# Patient Record
Sex: Male | Born: 1985 | Race: Black or African American | Hispanic: No | Marital: Single | Smoking: Current every day smoker
Health system: Southern US, Community
[De-identification: ages and names within clinical notes are randomized; demographics above are authoritative.]

---

## 2005-01-11 ENCOUNTER — Emergency Department: Payer: Self-pay | Admitting: Emergency Medicine

## 2005-08-17 ENCOUNTER — Emergency Department: Payer: Self-pay | Admitting: Unknown Physician Specialty

## 2010-05-09 ENCOUNTER — Emergency Department: Payer: Self-pay | Admitting: Emergency Medicine

## 2014-02-25 ENCOUNTER — Emergency Department: Payer: Self-pay | Admitting: Emergency Medicine

## 2016-08-11 ENCOUNTER — Encounter (HOSPITAL_COMMUNITY): Payer: Self-pay | Admitting: *Deleted

## 2016-08-11 DIAGNOSIS — S76212A Strain of adductor muscle, fascia and tendon of left thigh, initial encounter: Secondary | ICD-10-CM | POA: Insufficient documentation

## 2016-08-11 DIAGNOSIS — R1032 Left lower quadrant pain: Secondary | ICD-10-CM | POA: Insufficient documentation

## 2016-08-11 DIAGNOSIS — N50812 Left testicular pain: Secondary | ICD-10-CM | POA: Insufficient documentation

## 2016-08-11 DIAGNOSIS — F17200 Nicotine dependence, unspecified, uncomplicated: Secondary | ICD-10-CM | POA: Insufficient documentation

## 2016-08-11 DIAGNOSIS — Y929 Unspecified place or not applicable: Secondary | ICD-10-CM | POA: Insufficient documentation

## 2016-08-11 DIAGNOSIS — Y939 Activity, unspecified: Secondary | ICD-10-CM | POA: Insufficient documentation

## 2016-08-11 DIAGNOSIS — Y999 Unspecified external cause status: Secondary | ICD-10-CM | POA: Insufficient documentation

## 2016-08-11 DIAGNOSIS — W500XXA Accidental hit or strike by another person, initial encounter: Secondary | ICD-10-CM | POA: Insufficient documentation
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
## 2016-08-12 ENCOUNTER — Emergency Department (HOSPITAL_COMMUNITY): Payer: Self-pay

## 2016-08-12 ENCOUNTER — Emergency Department (HOSPITAL_COMMUNITY)
Admission: EM | Admit: 2016-08-12 | Discharge: 2016-08-12 | Disposition: A | Discharge status: Home / Self Care | Payer: Self-pay | Attending: Emergency Medicine | Admitting: Emergency Medicine

## 2016-08-12 DIAGNOSIS — S76212A Strain of adductor muscle, fascia and tendon of left thigh, initial encounter: Secondary | ICD-10-CM

## 2016-08-12 DIAGNOSIS — N50819 Testicular pain, unspecified: Secondary | ICD-10-CM

## 2016-08-12 LAB — CBC WITH DIFFERENTIAL/PLATELET
BASOS ABS: 0 10*3/uL (ref 0.0–0.1)
BASOS PCT: 0 %
EOS PCT: 3 %
Eosinophils Absolute: 0.2 10*3/uL (ref 0.0–0.7)
HCT: 39.7 % (ref 39.0–52.0)
Hemoglobin: 13.3 g/dL (ref 13.0–17.0)
Lymphocytes Relative: 28 %
Lymphs Abs: 2.7 10*3/uL (ref 0.7–4.0)
MCH: 31.3 pg (ref 26.0–34.0)
MCHC: 33.5 g/dL (ref 30.0–36.0)
MCV: 93.4 fL (ref 78.0–100.0)
MONO ABS: 0.8 10*3/uL (ref 0.1–1.0)
Monocytes Relative: 9 %
Neutro Abs: 6 10*3/uL (ref 1.7–7.7)
Neutrophils Relative %: 60 %
PLATELETS: 389 10*3/uL (ref 150–400)
RBC: 4.25 MIL/uL (ref 4.22–5.81)
RDW: 12.8 % (ref 11.5–15.5)
WBC: 9.8 10*3/uL (ref 4.0–10.5)

## 2016-08-12 LAB — URINALYSIS, ROUTINE W REFLEX MICROSCOPIC
GLUCOSE, UA: NEGATIVE mg/dL
Ketones, ur: NEGATIVE mg/dL
Nitrite: NEGATIVE
PROTEIN: NEGATIVE mg/dL
Specific Gravity, Urine: 1.031 — ABNORMAL HIGH (ref 1.005–1.030)
pH: 6 (ref 5.0–8.0)

## 2016-08-12 LAB — BASIC METABOLIC PANEL
ANION GAP: 10 (ref 5–15)
BUN: 15 mg/dL (ref 6–20)
CALCIUM: 9.7 mg/dL (ref 8.9–10.3)
CO2: 25 mmol/L (ref 22–32)
Chloride: 103 mmol/L (ref 101–111)
Creatinine, Ser: 1.23 mg/dL (ref 0.61–1.24)
GLUCOSE: 103 mg/dL — AB (ref 65–99)
Potassium: 3.6 mmol/L (ref 3.5–5.1)
Sodium: 138 mmol/L (ref 135–145)

## 2016-08-12 LAB — URINE MICROSCOPIC-ADD ON

## 2016-08-12 MED ORDER — IOPAMIDOL (ISOVUE-300) INJECTION 61%
INTRAVENOUS | Status: AC
  Administered 2016-08-12: 100 mL
  Filled 2016-08-12: qty 100

## 2016-08-12 MED ORDER — NAPROXEN 500 MG PO TABS: 500.0000 mg | ORAL_TABLET | Freq: Two times a day (BID) | ORAL | 0 refills | Status: AC

## 2016-08-12 MED ORDER — FENTANYL CITRATE (PF) 100 MCG/2ML IJ SOLN
50.0000 ug | Freq: Once | INTRAMUSCULAR | Status: AC
  Administered 2016-08-12: 50 ug via INTRAVENOUS
  Filled 2016-08-12: qty 2

## 2016-08-12 NOTE — Discharge Instructions (Signed)
Use antiinflammatories and ice as prescribed. Followup with your doctor. Return to the ED if you develop new or worsening symptoms.
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
## 2016-08-12 NOTE — ED Notes (Signed)
Patient transported to CT 
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
## 2017-03-10 ENCOUNTER — Encounter (HOSPITAL_COMMUNITY): Payer: Self-pay | Admitting: Emergency Medicine

## 2017-03-10 ENCOUNTER — Emergency Department (HOSPITAL_COMMUNITY): Payer: Self-pay

## 2017-03-10 ENCOUNTER — Emergency Department (HOSPITAL_COMMUNITY)
Admission: EM | Admit: 2017-03-10 | Discharge: 2017-03-10 | Disposition: A | Discharge status: Home / Self Care | Payer: Self-pay | Attending: Emergency Medicine | Admitting: Emergency Medicine

## 2017-03-10 DIAGNOSIS — R109 Unspecified abdominal pain: Secondary | ICD-10-CM

## 2017-03-10 DIAGNOSIS — R1084 Generalized abdominal pain: Secondary | ICD-10-CM | POA: Insufficient documentation

## 2017-03-10 DIAGNOSIS — F17200 Nicotine dependence, unspecified, uncomplicated: Secondary | ICD-10-CM | POA: Insufficient documentation

## 2017-03-10 LAB — CBC WITH DIFFERENTIAL/PLATELET
BASOS ABS: 0 10*3/uL (ref 0.0–0.1)
Basophils Relative: 0 %
Eosinophils Absolute: 0.1 10*3/uL (ref 0.0–0.7)
Eosinophils Relative: 1 %
HEMATOCRIT: 45.7 % (ref 39.0–52.0)
Hemoglobin: 15.5 g/dL (ref 13.0–17.0)
LYMPHS PCT: 28 %
Lymphs Abs: 2.1 10*3/uL (ref 0.7–4.0)
MCH: 32.2 pg (ref 26.0–34.0)
MCHC: 33.9 g/dL (ref 30.0–36.0)
MCV: 95 fL (ref 78.0–100.0)
Monocytes Absolute: 0.6 10*3/uL (ref 0.1–1.0)
Monocytes Relative: 8 %
Neutro Abs: 4.8 10*3/uL (ref 1.7–7.7)
Neutrophils Relative %: 63 %
PLATELETS: 389 10*3/uL (ref 150–400)
RBC: 4.81 MIL/uL (ref 4.22–5.81)
RDW: 12.6 % (ref 11.5–15.5)
WBC: 7.6 10*3/uL (ref 4.0–10.5)

## 2017-03-10 LAB — URINALYSIS, ROUTINE W REFLEX MICROSCOPIC
BACTERIA UA: NONE SEEN
Bilirubin Urine: NEGATIVE
Glucose, UA: NEGATIVE mg/dL
KETONES UR: NEGATIVE mg/dL
Leukocytes, UA: NEGATIVE
Nitrite: NEGATIVE
PH: 5 (ref 5.0–8.0)
PROTEIN: 30 mg/dL — AB
SQUAMOUS EPITHELIAL / LPF: NONE SEEN
Specific Gravity, Urine: 1.033 — ABNORMAL HIGH (ref 1.005–1.030)

## 2017-03-10 LAB — LIPASE, BLOOD: LIPASE: 40 U/L (ref 11–51)

## 2017-03-10 LAB — COMPREHENSIVE METABOLIC PANEL
ALT: 25 U/L (ref 17–63)
AST: 22 U/L (ref 15–41)
Albumin: 3.6 g/dL (ref 3.5–5.0)
Alkaline Phosphatase: 52 U/L (ref 38–126)
Anion gap: 7 (ref 5–15)
BILIRUBIN TOTAL: 1 mg/dL (ref 0.3–1.2)
BUN: 7 mg/dL (ref 6–20)
CHLORIDE: 106 mmol/L (ref 101–111)
CO2: 26 mmol/L (ref 22–32)
CREATININE: 1.09 mg/dL (ref 0.61–1.24)
Calcium: 9 mg/dL (ref 8.9–10.3)
GFR calc Af Amer: >60 mL/min (ref >60)
GLUCOSE: 93 mg/dL (ref 65–99)
Potassium: 3.7 mmol/L (ref 3.5–5.1)
Sodium: 139 mmol/L (ref 135–145)
Total Protein: 6.2 g/dL — ABNORMAL LOW (ref 6.5–8.1)

## 2017-03-10 MED ORDER — IOPAMIDOL (ISOVUE-300) INJECTION 61%
INTRAVENOUS | Status: AC
  Administered 2017-03-10: 100 mL via INTRAVENOUS
  Filled 2017-03-10: qty 100

## 2017-03-10 MED ORDER — DICYCLOMINE HCL 20 MG PO TABS: 20.0000 mg | ORAL_TABLET | Freq: Two times a day (BID) | ORAL | 0 refills | Status: AC

## 2017-03-10 MED ORDER — SODIUM CHLORIDE 0.9 % IV BOLUS (SEPSIS)
1000.0000 mL | Freq: Once | INTRAVENOUS | Status: AC
  Administered 2017-03-10: 1000 mL via INTRAVENOUS

## 2017-03-10 MED ORDER — GI COCKTAIL ~~LOC~~
30.0000 mL | Freq: Once | ORAL | Status: AC
  Administered 2017-03-10: 30 mL via ORAL
  Filled 2017-03-10: qty 30

## 2017-03-10 MED ORDER — FAMOTIDINE 20 MG PO TABS: 20.0000 mg | ORAL_TABLET | Freq: Two times a day (BID) | ORAL | 0 refills | Status: AC

## 2017-03-10 NOTE — ED Notes (Signed)
Lab reports there are tubes of blood for this patient in lab. Will run ordered labs.

## 2017-03-10 NOTE — ED Notes (Signed)
ED Provider at bedside. 

## 2017-03-10 NOTE — ED Triage Notes (Signed)
Pt. Stated, I've had stomach pain with N/V/D for 5 days.

## 2017-03-10 NOTE — Discharge Instructions (Signed)
Your blood work including electrolytes, kidney function, liver function, pancreas function all normal. Your CT scan is normal as well. Take pepcid and bentyl as prescribed. Follow up with family doctor if not improving this week. Drink plenty of fluids. Eat bland diet until improved.

## 2017-03-10 NOTE — ED Provider Notes (Signed)
[facility]PT Provider Note   CSN: [id number] Arrival date & time: 03/10/17  1016     History   Chief Complaint Chief Complaint  Patient presents with  . Abdominal Pain  . Nausea  . Diarrhea  . Emesis    HPI Natanael Saladin. is a 31 y.o. male.  HPI Abdulla Pooley. is a 31 y.o. male presents to ED with complaint of abdominal pain, nausea, vomiting for 5 days. States symptoms are worsening. Reports diffuse, crampy abdominal pain that is worse with eating. Reports intermittent loose stools with hard stools. Initially states was constipated but has had several bowel movements since then, with some water diarrhea. Denies blood in stool. Although vomiting, states is able to keep some fluids down. Reports loss of appetite. Denies fever, chills. No urinary symptoms. No back pain. No other complaints.   History reviewed. No pertinent past medical history.  There are no active problems to display for this patient.   History reviewed. No pertinent surgical history.     Home Medications    Prior to Admission medications   Medication Sig Start Date End Date Taking? Authorizing Provider  naproxen (NAPROSYN) 500 MG tablet Take 1 tablet (500 mg total) by mouth 2 (two) times daily. 08/12/16   [name], MD    Family History No family history on file.  Social History Social History  Substance Use Topics  . Smoking status: Current Every Day Smoker  . Smokeless tobacco: Never Used  . Alcohol use Yes     Allergies   Patient has no known allergies.   Review of Systems Review of Systems  Constitutional: Negative for chills and fever.  Respiratory: Negative for cough, chest tightness and shortness of breath.   Cardiovascular: Negative for chest pain, palpitations and leg swelling.  Gastrointestinal: Positive for abdominal pain, diarrhea, nausea and vomiting. Negative for abdominal distention.  Genitourinary: Negative for dysuria, frequency, hematuria and  urgency.  Musculoskeletal: Negative for arthralgias, myalgias, neck pain and neck stiffness.  Skin: Negative for rash.  Allergic/Immunologic: Negative for immunocompromised state.  Neurological: Negative for dizziness, weakness, light-headedness, numbness and headaches.  All other systems reviewed and are negative.    Physical Exam Updated Vital Signs BP 119/86 (BP Location: Right Arm)   Pulse 63   Temp 97.6 F (36.4 C) (Oral)   Resp 20   Ht 5\' 8"  (1.727 m)   Wt 91.2 kg (201 lb)   SpO2 99%   BMI 30.56 kg/m   Physical Exam  Constitutional: He is oriented to person, place, and time. He appears well-developed and well-nourished. No distress.  HENT:  Head: Normocephalic and atraumatic.  Eyes: Conjunctivae are normal.  Neck: Neck supple.  Cardiovascular: Normal rate, regular rhythm and normal heart sounds.   Pulmonary/Chest: Effort normal. No respiratory distress. He has no wheezes. He has no rales.  Abdominal: Soft. Bowel sounds are normal. He exhibits no distension. There is tenderness. There is no rebound.  Diffuse tenderness worse in the left upper quadrant and epigastric area  Musculoskeletal: He exhibits no edema.  Neurological: He is alert and oriented to person, place, and time.  Skin: Skin is warm and dry.  Nursing note and vitals reviewed.    ED Treatments / Results  Labs (all labs ordered are listed, but only abnormal results are displayed) Labs Reviewed  COMPREHENSIVE METABOLIC PANEL - Abnormal; Notable for the following:       Result Value   Total Protein 6.2 (*)  All other components within normal limits  URINALYSIS, ROUTINE W REFLEX MICROSCOPIC - Abnormal; Notable for the following:    Color, Urine AMBER (*)    Specific Gravity, Urine 1.033 (*)    Hgb urine dipstick SMALL (*)    Protein, ur 30 (*)    All other components within normal limits  CBC WITH DIFFERENTIAL/PLATELET  LIPASE, BLOOD    EKG  EKG Interpretation None       Radiology Ct  Abdomen Pelvis W Contrast  Result Date: 03/10/2017 CLINICAL DATA:  Abdominal pain for several days with nausea, vomiting and diarrhea. EXAM: CT ABDOMEN AND PELVIS WITH CONTRAST TECHNIQUE: Multidetector CT imaging of the abdomen and pelvis was performed using the standard protocol following bolus administration of intravenous contrast. CONTRAST:  ISOVUE-300 IOPAMIDOL (ISOVUE-300) INJECTION 61% COMPARISON:  08/12/2016 CT abdomen/ pelvis. FINDINGS: Lower chest: No significant pulmonary nodules or acute consolidative airspace disease. Hepatobiliary: Normal liver with no liver mass. Normal gallbladder with no radiopaque cholelithiasis. No biliary ductal dilatation. Pancreas: Normal, with no mass or duct dilation. Spleen: Normal size. No mass. Adrenals/Urinary Tract: Normal adrenals. Subcentimeter hypodense renal cortical lesions in the lower kidneys bilaterally are too small to characterize, appear unchanged and require no further follow-up. No new renal lesions. No hydronephrosis. Normal bladder. Stomach/Bowel: Grossly normal stomach. Normal caliber small bowel with no small bowel wall thickening. Normal appendix. Oral contrast progresses to the rectum. No definite large bowel wall thickening or pericolonic fat stranding. No colonic diverticulosis. Vascular/Lymphatic: Normal caliber abdominal aorta. Patent portal, splenic, hepatic and renal veins. No pathologically enlarged lymph nodes in the abdomen or pelvis. Reproductive: Normal size prostate. Other: No pneumoperitoneum, ascites or focal fluid collection. Stable small fat containing umbilical hernia. Musculoskeletal: No aggressive appearing focal osseous lesions. IMPRESSION: No acute abnormality. No evidence of bowel obstruction or acute bowel inflammation. Normal appendix. Stable small fat containing umbilical hernia. Electronically Signed   By: Delbert Phenix M.D.   On: 03/10/2017 14:49    Procedures Procedures (including critical care  time)  Medications Ordered in ED Medications  gi cocktail (Maalox,Lidocaine,Donnatal) (30 mLs Oral Given 03/10/17 1339)  sodium chloride 0.9 % bolus 1,000 mL (1,000 mLs Intravenous New Bag/Given 03/10/17 1340)  iopamidol (ISOVUE-300) 61 % injection (100 mLs Intravenous Contrast Given 03/10/17 1409)     Initial Impression / Assessment and Plan / ED Course  I have reviewed the triage vital signs and the nursing notes.  Pertinent labs & imaging results that were available during my care of the patient were reviewed by me and considered in my medical decision making (see chart for details).     He should emergency department with worsening abdominal pain, nausea, vomiting, intermittent diarrhea. Patient's pain is worsening over last 5 days. He does have significant tenderness worse in the left upper quadrant epigastric area. Will check labs, get CT abdomen and pelvis for further evaluation.  2:57 PM Labs and CT all unremarkable. Patient had some pain relief with GI cocktail. Question gastritis versus viral gastrointestinal infection. Will start on Pepcid, Bentyl for abdominal cramping, follow-up with family doctor as needed. Tolerating POs in ED.   Vitals:   03/10/17 1038 03/10/17 1039 03/10/17 1251  BP: (!) 136/93  119/86  Pulse: (!) 6  63  Resp: 16  20  Temp: 97.6 F (36.4 C)    TempSrc: Oral    SpO2: 100%  99%  Weight:  91.2 kg (201 lb)   Height:  5\' 8"  (1.727 m)  Final Clinical Impressions(s) / ED Diagnoses   Final diagnoses:  Abdominal pain, unspecified abdominal location    New Prescriptions New Prescriptions   DICYCLOMINE (BENTYL) 20 MG TABLET    Take 1 tablet (20 mg total) by mouth 2 (two) times daily.   FAMOTIDINE (PEPCID) 20 MG TABLET    Take 1 tablet (20 mg total) by mouth 2 (two) times daily.     Jaynie CrumbleKirichenko, Justice Milliron, PA-C 03/10/17 1501    Arby BarrettePfeiffer, Marcy, MD 03/10/17 [phone]

## 2018-05-31 IMAGING — CT CT ABD-PELV W/ CM
2 of 4 series · 17 of 46 positions shown, 19 images · IV contrast (Omni 300)
Comparison: Ultrasound 08/12/2016

CLINICAL DATA: Left groin pain.

EXAM:
CT ABDOMEN AND PELVIS WITH CONTRAST
TECHNIQUE: Multidetector CT imaging of the abdomen and pelvis was performed
using the standard protocol following bolus administration of
intravenous contrast.
CONTRAST:  100mL 457NKW-TFF IOPAMIDOL (457NKW-TFF) INJECTION 61%

[Series 2: a/p w/ 5mm · axial · 0.88mm/px · z∈[+682,+1122]mm · 14 of 96 slices shown, 16 images]
[im 4/96  soft-tissue]
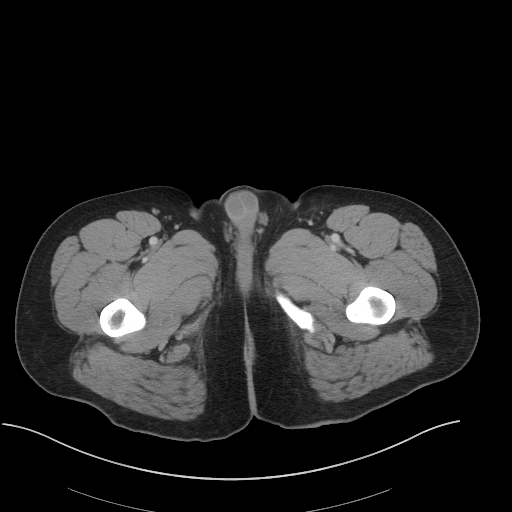
[im 4/96  bone]
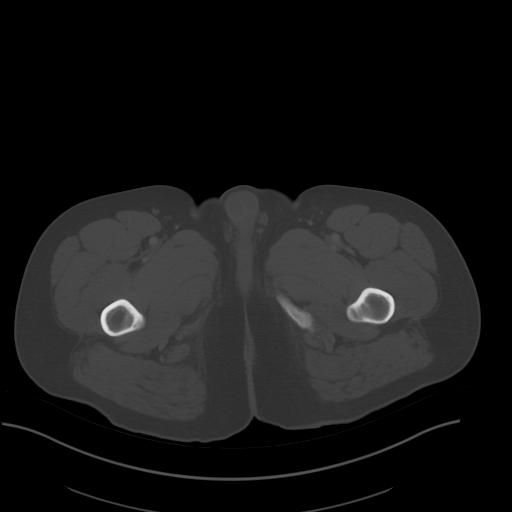
[im 12/96  soft-tissue]
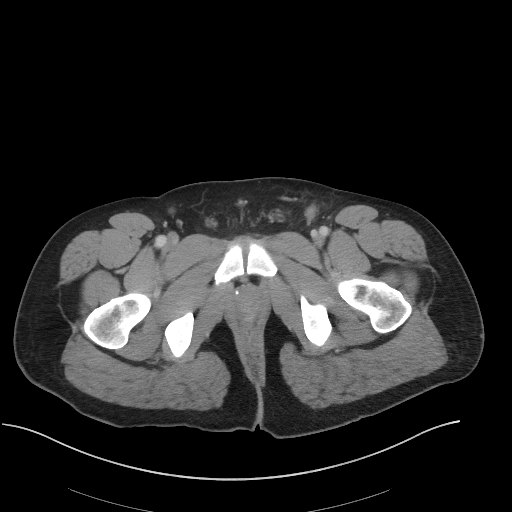
[im 20/96  soft-tissue]
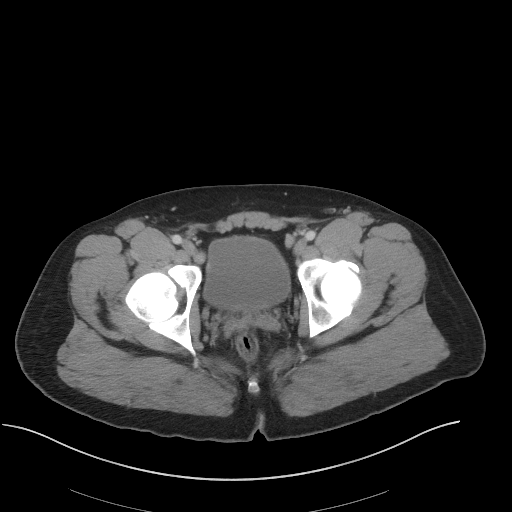
[im 24/96  soft-tissue]
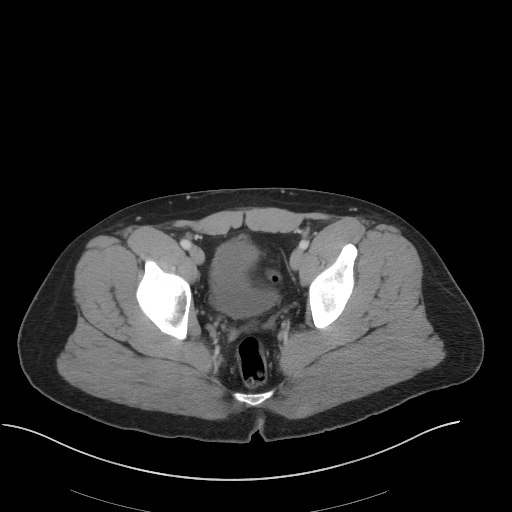
[im 32/96  soft-tissue]
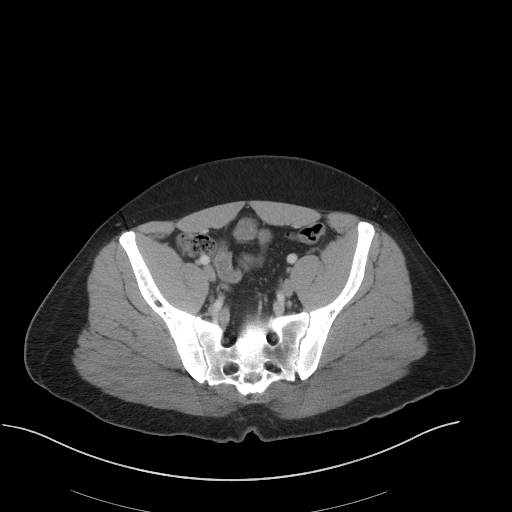
[im 40/96  soft-tissue]
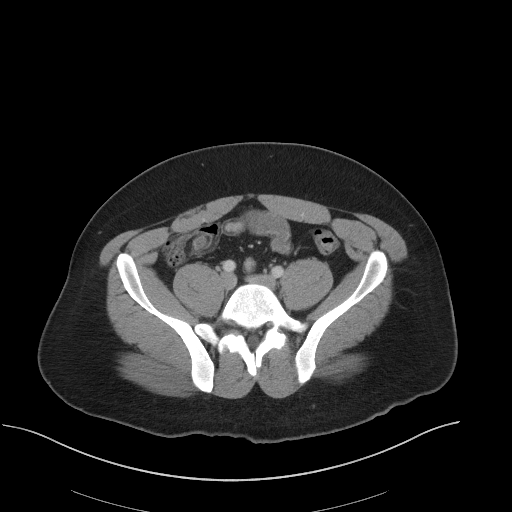
[im 44/96  soft-tissue]
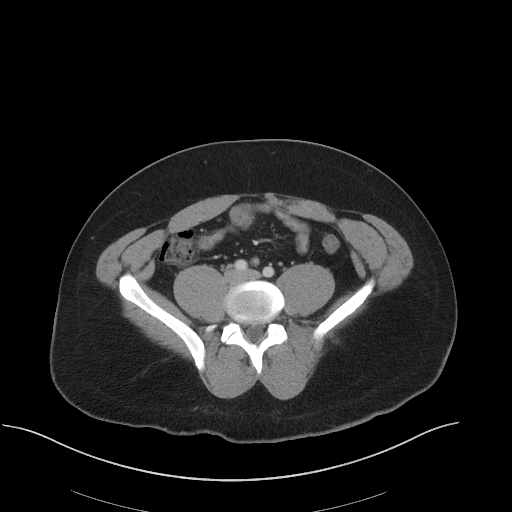
[im 52/96  soft-tissue]
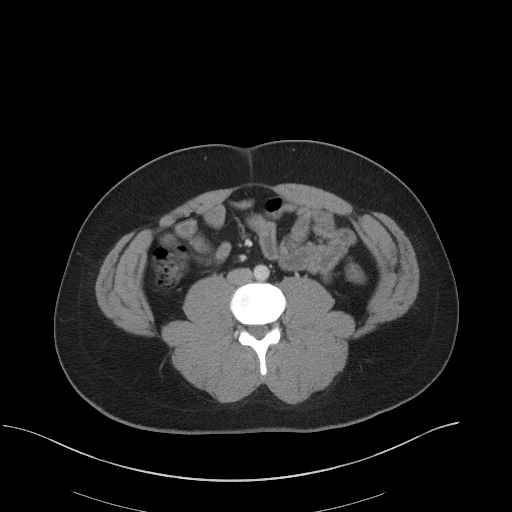
[im 56/96  soft-tissue]
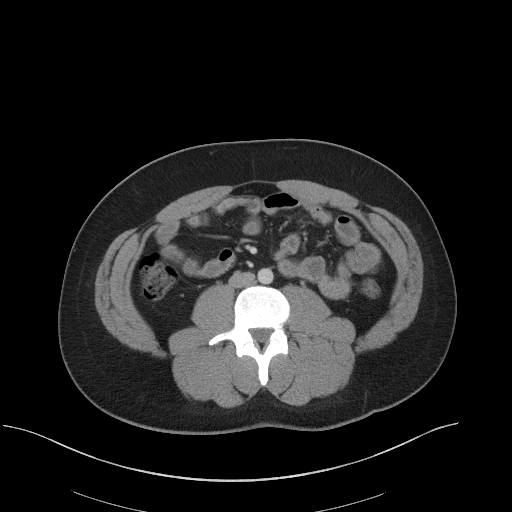
[im 56/96  bone]
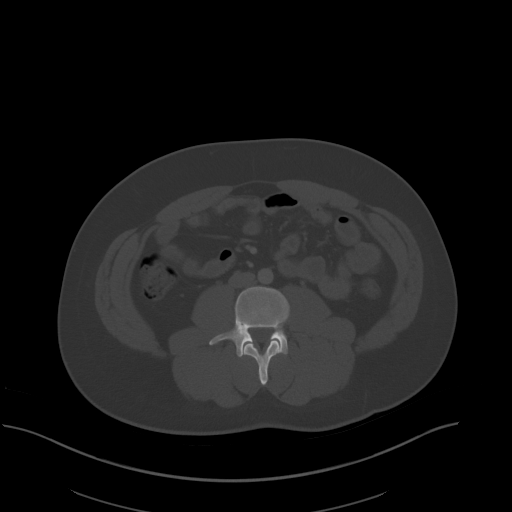
[im 64/96  soft-tissue]
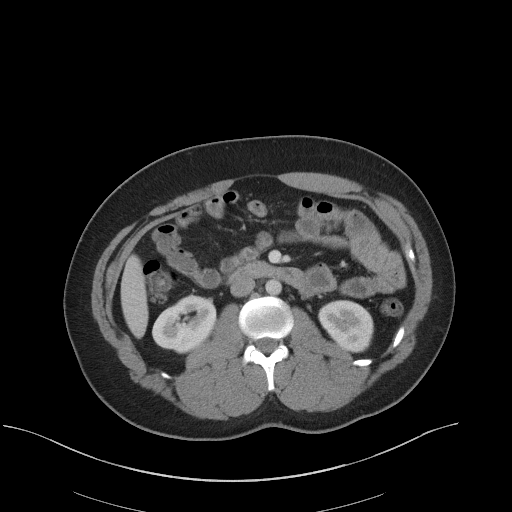
[im 72/96  soft-tissue]
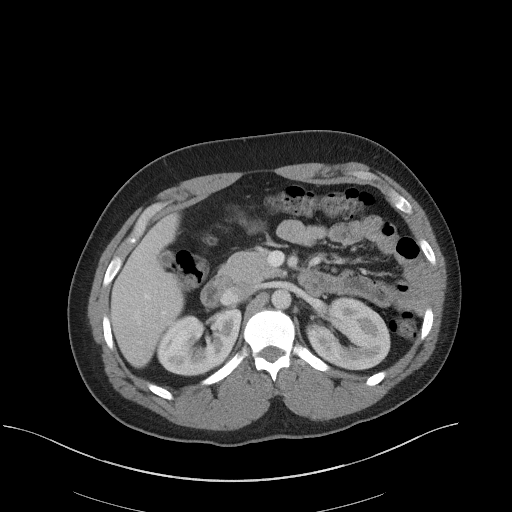
[im 76/96  soft-tissue]
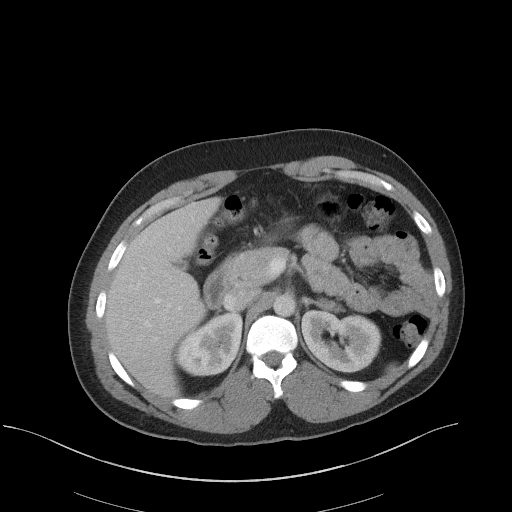
[im 84/96  soft-tissue]
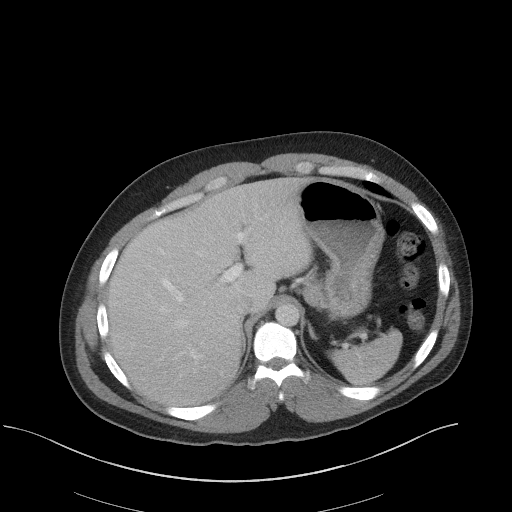
[im 92/96  soft-tissue]
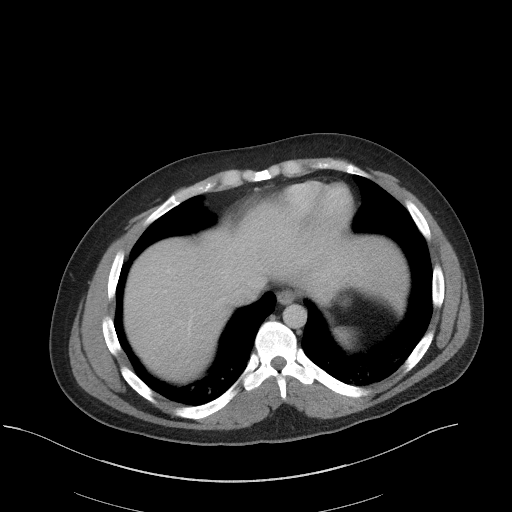

[Series 5: a/p w/ cor · coronal · 0.85mm/px · 3 of 124 slices shown]
[im 42/124  soft-tissue]
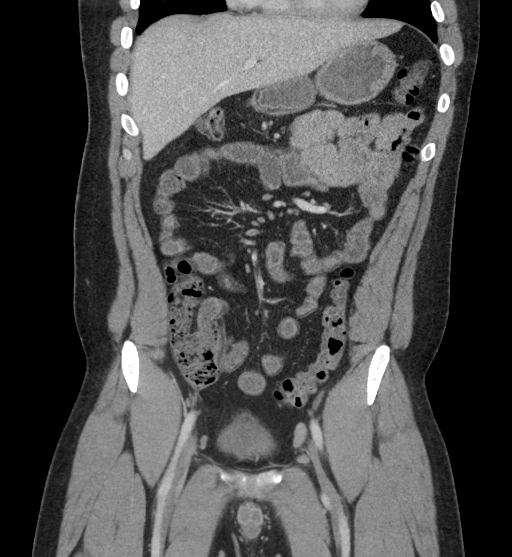
[im 55/124  soft-tissue]
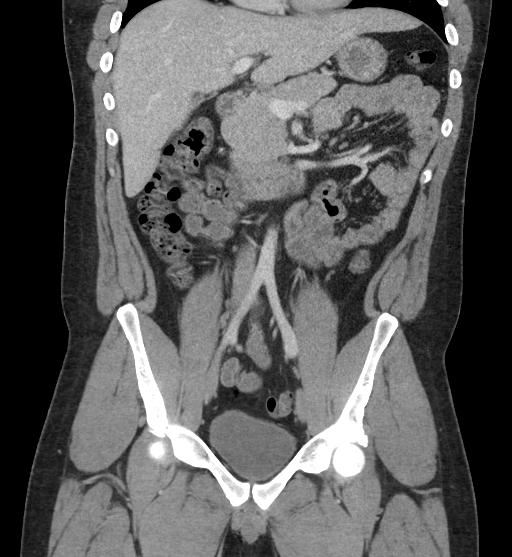
[im 69/124  soft-tissue]
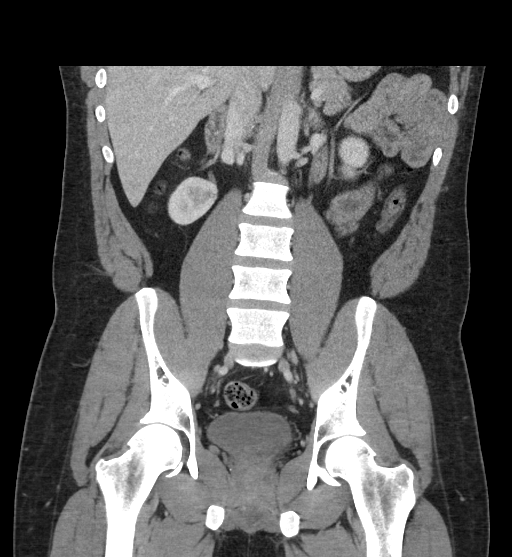

[17 of 46 positions shown; findings below may reference images not displayed]

FINDINGS: Lower chest: No acute abnormality.

Hepatobiliary: No focal liver abnormality is seen. No gallstones,
gallbladder wall thickening, or biliary dilatation.

Pancreas: Unremarkable. No pancreatic ductal dilatation or
surrounding inflammatory changes.

Spleen: Normal in size without focal abnormality.

Adrenals/Urinary Tract: Adrenal glands are unremarkable. Kidneys are
normal, without renal calculi, focal lesion, or hydronephrosis.
Bladder is unremarkable.

Stomach/Bowel: Stomach is within normal limits. Appendix is normal.
No evidence of bowel wall thickening, distention, or inflammatory
changes.

Vascular/Lymphatic: No significant vascular findings are present. No
enlarged abdominal or pelvic lymph nodes.

Reproductive: Prostate is unremarkable.

Other: No inguinal hernia.  Small fat containing umbilical hernia.

Musculoskeletal: No acute or significant osseous findings.
IMPRESSION: No acute findings.  Small fat containing umbilical hernia.

## 2018-12-29 ENCOUNTER — Encounter: Payer: Self-pay | Admitting: Emergency Medicine

## 2018-12-29 ENCOUNTER — Emergency Department
Admission: EM | Admit: 2018-12-29 | Discharge: 2018-12-29 | Disposition: A | Discharge status: Home / Self Care | Payer: Self-pay | Attending: Emergency Medicine | Admitting: Emergency Medicine

## 2018-12-29 ENCOUNTER — Other Ambulatory Visit: Payer: Self-pay

## 2018-12-29 DIAGNOSIS — F17210 Nicotine dependence, cigarettes, uncomplicated: Secondary | ICD-10-CM | POA: Insufficient documentation

## 2018-12-29 DIAGNOSIS — Z79899 Other long term (current) drug therapy: Secondary | ICD-10-CM | POA: Insufficient documentation

## 2018-12-29 DIAGNOSIS — J069 Acute upper respiratory infection, unspecified: Secondary | ICD-10-CM | POA: Insufficient documentation

## 2018-12-29 NOTE — ED Notes (Signed)
First Nurse Note: Patient complaining of fever, cough, and weakness.  Arrived wearing adult face mask.

## 2018-12-29 NOTE — ED Triage Notes (Signed)
Presents with 2 day hx of cough and low grade fever

## 2018-12-29 NOTE — ED Provider Notes (Signed)
[facility] Emergency Department Provider Note   ____________________________________________    I have reviewed the triage vital signs and the nursing notes.   HISTORY  Chief Complaint Fever and Cough     HPI Reinhard Sirak. is a 33 y.o. male who presents with complaints of mild cough, chills and body aches.  She reports over the last 2 to 3 days he has had a mild cough as well which he is attributed to her allergies.  .  No shortness of breath.  No known exposure to covid positive patient.  No recent travel.  Daughter has a viral illness as well  History reviewed. No pertinent past medical history.  There are no active problems to display for this patient.   History reviewed. No pertinent surgical history.  Prior to Admission medications   Medication Sig Start Date End Date Taking? Authorizing Provider  dicyclomine (BENTYL) 20 MG tablet Take 1 tablet (20 mg total) by mouth 2 (two) times daily. 03/10/17   Kirichenko, Tatyana, PA-C  famotidine (PEPCID) 20 MG tablet Take 1 tablet (20 mg total) by mouth 2 (two) times daily. 03/10/17   Kirichenko, Tatyana, PA-C  naproxen (NAPROSYN) 500 MG tablet Take 1 tablet (500 mg total) by mouth 2 (two) times daily. 08/12/16   [name], MD     Allergies Patient has no known allergies.  No family history on file.  Social History Social History   Tobacco Use  . Smoking status: Current Every Day Smoker  . Smokeless tobacco: Never Used  Substance Use Topics  . Alcohol use: Yes  . Drug use: Not on file    Review of Systems  Constitutional: As above Eyes: No visual changes.  ENT: Positive runny nose Cardiovascular: Denies chest pain. Respiratory: Denies shortness of breath.  Cough as above Gastrointestinal: No abdominal pain.  No nausea, no vomiting.   Genitourinary: Negative for dysuria. Musculoskeletal: Positive myalgias Skin: Negative for rash. Neurological: Negative for headaches    ____________________________________________   PHYSICAL EXAM:  VITAL SIGNS: ED Triage Vitals  Enc Vitals Group     BP 12/29/18 0836 (!) 114/57     Pulse Rate 12/29/18 0836 75     Resp 12/29/18 0836 18     Temp 12/29/18 0836 97.6 F (36.4 C)     Temp Source 12/29/18 0836 Oral     SpO2 12/29/18 0836 100 %     Weight 12/29/18 0837 90.7 kg (200 lb)     Height 12/29/18 0837 1.702 m (5\' 7" )     Head Circumference --      Peak Flow --      Pain Score 12/29/18 0836 3     Pain Loc --      Pain Edu? --      Excl. in GC? --     Constitutional: Alert and oriented. No acute distress.  Eyes: Conjunctivae are normal.   Nose: No congestion/rhinnorhea. Mouth/Throat: Mucous membranes are moist.   Neck:  Painless ROM Cardiovascular: Normal rate, regular rhythm.  Good peripheral circulation. Respiratory: Normal respiratory effort.  No retractions.  Gastrointestinal: Soft and nontender. No distention.    Musculoskeletal: No lower extremity tenderness nor edema.  Warm and well perfused Neurologic:  Normal speech and language. No gross focal neurologic deficits are appreciated.  Skin:  Skin is warm, dry and intact. No rash noted. Psychiatric: Mood and affect are normal. Speech and behavior are normal.  ____________________________________________   LABS (all labs ordered are  listed, but only abnormal results are displayed)  Labs Reviewed - No data to display ____________________________________________  EKG   ____________________________________________  RADIOLOGY  None ____________________________________________   PROCEDURES  Procedure(s) performed: No  Procedures   Critical Care performed: No ____________________________________________   INITIAL IMPRESSION / ASSESSMENT AND PLAN / ED COURSE  Pertinent labs & imaging results that were available during my care of the patient were reviewed by me and considered in my medical decision making (see chart for details).   Patient well-appearing and in no acute distress.  Differential diagnosis includes viral illness including novel coronavirus versus influenza.  Vital signs unremarkable, reassuring exam, recommend strict quarantine supportive care.  Milana HuntsmanWalter C Cabeza Jr. was evaluated in Emergency Department on 12/29/2018 for the symptoms described in the history of present illness. He was evaluated in the context of the global COVID-19 pandemic, which necessitated consideration that the patient might be at risk for infection with the SARS-CoV-2 virus that causes COVID-19. Institutional protocols and algorithms that pertain to the evaluation of patients at risk for COVID-19 are in a state of rapid change based on information released by regulatory bodies including the CDC and federal and state organizations. These policies and algorithms were followed during the patient's care in the ED.    ____________________________________________   FINAL CLINICAL IMPRESSION(S) / ED DIAGNOSES  Final diagnoses:  Viral upper respiratory tract infection        Note:  This document was prepared using Dragon voice recognition software and may include unintentional dictation errors.   Jene EveryKinner, Kupono Marling, MD 12/29/18 [phone]

## 2018-12-29 NOTE — ED Notes (Signed)
Patient to Rm 30 ambulatory.  Aggie Cosier RN aware of room placement.

## 2018-12-29 NOTE — ED Notes (Addendum)
Pt states cough started 5 days ago and fever started 6 days- c/o fatigue and runny nose- daughter has been recently sick- denies travel and contact with covid- pt states he works outside

## 2020-01-10 ENCOUNTER — Emergency Department: Payer: Self-pay

## 2020-01-10 ENCOUNTER — Emergency Department
Admission: EM | Admit: 2020-01-10 | Discharge: 2020-01-10 | Disposition: A | Discharge status: Home / Self Care | Payer: Self-pay | Attending: Emergency Medicine | Admitting: Emergency Medicine

## 2020-01-10 ENCOUNTER — Encounter: Payer: Self-pay | Admitting: Emergency Medicine

## 2020-01-10 ENCOUNTER — Other Ambulatory Visit: Payer: Self-pay

## 2020-01-10 DIAGNOSIS — F17200 Nicotine dependence, unspecified, uncomplicated: Secondary | ICD-10-CM | POA: Diagnosis not present

## 2020-01-10 DIAGNOSIS — Y999 Unspecified external cause status: Secondary | ICD-10-CM | POA: Diagnosis not present

## 2020-01-10 DIAGNOSIS — M542 Cervicalgia: Secondary | ICD-10-CM | POA: Diagnosis present

## 2020-01-10 DIAGNOSIS — M25511 Pain in right shoulder: Secondary | ICD-10-CM | POA: Diagnosis not present

## 2020-01-10 DIAGNOSIS — Y92410 Unspecified street and highway as the place of occurrence of the external cause: Secondary | ICD-10-CM | POA: Diagnosis not present

## 2020-01-10 DIAGNOSIS — Y9389 Activity, other specified: Secondary | ICD-10-CM | POA: Insufficient documentation

## 2020-01-10 MED ORDER — METHOCARBAMOL 500 MG PO TABS: 500.0000 mg | ORAL_TABLET | Freq: Three times a day (TID) | ORAL | 0 refills | Status: AC | PRN

## 2020-01-10 MED ORDER — METHOCARBAMOL 500 MG PO TABS
1000.0000 mg | ORAL_TABLET | Freq: Once | ORAL | Status: AC
  Administered 2020-01-10: 1000 mg via ORAL
  Filled 2020-01-10: qty 2

## 2020-01-10 MED ORDER — MELOXICAM 15 MG PO TABS: 15.0000 mg | ORAL_TABLET | Freq: Every day | ORAL | 1 refills | Status: AC

## 2020-01-10 MED ORDER — KETOROLAC TROMETHAMINE 30 MG/ML IJ SOLN
30.0000 mg | Freq: Once | INTRAMUSCULAR | Status: AC
  Administered 2020-01-10: 19:00:00 30 mg via INTRAMUSCULAR
  Filled 2020-01-10: qty 1
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
## 2023-10-28 ENCOUNTER — Ambulatory Visit: Payer: Self-pay

## 2023-10-29 ENCOUNTER — Ambulatory Visit: Payer: Self-pay | Admitting: Family Medicine

## 2023-10-29 ENCOUNTER — Encounter: Payer: Self-pay | Admitting: Family Medicine

## 2023-10-29 DIAGNOSIS — Z202 Contact with and (suspected) exposure to infections with a predominantly sexual mode of transmission: Secondary | ICD-10-CM | POA: Insufficient documentation

## 2023-10-29 DIAGNOSIS — Z113 Encounter for screening for infections with a predominantly sexual mode of transmission: Secondary | ICD-10-CM

## 2023-10-29 LAB — HM HIV SCREENING LAB: HM HIV Screening: NEGATIVE

## 2023-10-29 MED ORDER — METRONIDAZOLE 500 MG PO TABS: 2000.0000 mg | ORAL_TABLET | Freq: Once | ORAL | Status: AC

## 2023-10-29 NOTE — Progress Notes (Signed)
 Pt is here for STD screening. The patient was dispensed Metronidazole  2g once  today. I provided counseling today regarding the medication. We discussed the medication, the side effects and when to call clinic. Patient given the opportunity to ask questions for any clarifications. Condoms given, brochure given. Wilkie Drought, RN.

## 2023-10-29 NOTE — Patient Instructions (Signed)
 Trichomonas contact Today we gave you the presumptive treatment for a trichomonas contact.  This medicine is called metronidazole .   STI screening - Today we obtained a vaginal swab to screen for gonorrhea, chlamydia, and trichomonas - We also obtained a blood sample to screen for HIV and syphilis - If the results are normal, I will send you a letter or MyChart message. If the results are abnormal, I will give you a call.    Estimated time frame for results collected at the [facility] Department: Same day Trichomonas Yeast BV (bacterial vaginosis)  Within 2 weeks Gonorrhea Chlamydia  Within 3-4 weeks HIV Syphilis Hepatitis B Hepatitis C

## 2023-10-29 NOTE — Progress Notes (Signed)
 [facility] Department STI clinic [address] [place] [address] Main phone: [phone]  STI screening visit  Subjective:  Daniel Conrad. is a 38 y.o. male being seen today for an STI screening visit. The patient reports they do not have symptoms.    Patient has the following medical conditions:  Patient Active Problem List   Diagnosis Date Noted   Trichomonas contact 10/29/2023   Chief Complaint  Patient presents with   SEXUALLY TRANSMITTED DISEASE   Exposure to STD    Pt is here for STD screening and report being contact to Trich and has no symptoms   HPI Patient reports trichomonas contact x 2 weeks ago. No symptoms.   Smoking 5-6 cig/day - job stress For about 10 years  STI screening history: Last HIV test per patient/review of record was No results found for: HMHIVSCREEN No results found for: HIV  Last HEPC test per patient/review of record was No results found for: HMHEPCSCREEN No components found for: HEPC   Last HEPB test per patient/review of record was No components found for: HMHEPBSCREEN   Fertility: Does the patient or their partner desires a pregnancy in the next year? No  Screening for MPX risk: Does the patient have an unexplained rash? No Is the patient MSM? No Does the patient endorse multiple sex partners or anonymous sex partners? No Did the patient have close or sexual contact with a person diagnosed with MPX? No Has the patient traveled outside the [place]  where MPX is endemic? No Is there a high clinical suspicion for MPX-- evidenced by one of the following No  -Unlikely to be chickenpox  -Lymphadenopathy  -Rash that present in same phase of evolution on any given body part   See flowsheet for further details and programmatic requirements.    There is no immunization history on file for this patient.   The following portions of the patient's history were reviewed and updated as  appropriate: allergies, current medications, past medical history, past social history, past surgical history and problem list.  Objective:  There were no vitals filed for this visit.  Physical Exam Vitals and nursing note reviewed.  Constitutional:      Appearance: Normal appearance.  HENT:     Head: Normocephalic and atraumatic.     Mouth/Throat:     Mouth: Mucous membranes are moist.     Pharynx: No oropharyngeal exudate or posterior oropharyngeal erythema.  Eyes:     General: No scleral icterus.       Right eye: No discharge.        Left eye: No discharge.     Conjunctiva/sclera: Conjunctivae normal.     Right eye: Right conjunctiva is not injected. No exudate.    Left eye: Left conjunctiva is not injected. No exudate. Pulmonary:     Effort: Pulmonary effort is normal.  Abdominal:     General: Abdomen is flat.     Palpations: There is no hepatomegaly.  Genitourinary:    Comments: Declined genital exam- asymptomatic Musculoskeletal:        General: Normal range of motion.     Cervical back: Neck supple. No rigidity or tenderness.  Lymphadenopathy:     Cervical: No cervical adenopathy.     Upper Body:     Right upper body: No supraclavicular or axillary adenopathy.     Left upper body: No supraclavicular or axillary adenopathy.  Skin:    General: Skin is warm and dry.  Capillary Refill: Capillary refill takes less than 2 seconds.  Neurological:     General: No focal deficit present.     Mental Status: He is alert and oriented to person, place, and time.  Psychiatric:        Mood and Affect: Mood normal.        Behavior: Behavior normal.    Assessment and Plan:  Clemon Devaul. is a 38 y.o. male presenting to the [facility] Department for STI screening  Screening examination for venereal disease -     Chlamydia/GC NAA, Confirmation -     HIV [facility] -     Syphilis Serology,  Lab  Trichomonas contact -     metroNIDAZOLE ; Take 4  tablets (2,000 mg total) by mouth once for 1 dose.   Patient does not have STI symptoms Patient accepted all screenings including urine GC/Chlamydia, and blood work for HIV/Syphilis. Patient meets criteria for HepB screening? No. Ordered? not applicable Patient meets criteria for HepC screening? No. Ordered? not applicable Recommended condom use with all sex Discussed importance of condom use for STI prevention  Treat positive test results per standing order. Discussed time line for [facility] results and that patient will be called with positive results and encouraged patient to call if he had not heard in 2 weeks Recommended repeat testing in 3 months with positive results. Recommended returning for continued or worsening symptoms.   No follow-ups on file.  No future appointments.  Betsey CHRISTELLA Helling, MD

## 2023-11-02 LAB — CHLAMYDIA/GC NAA, CONFIRMATION
Chlamydia trachomatis, NAA: NEGATIVE
Neisseria gonorrhoeae, NAA: NEGATIVE
